# Patient Record
Sex: Male | Born: 1995 | Race: White | Hispanic: No | Marital: Single | Smoking: Never smoker
Health system: Southern US, Community
[De-identification: ages and names within clinical notes are randomized; demographics above are authoritative.]

## PROBLEM LIST (undated history)

## (undated) DIAGNOSIS — F209 Schizophrenia, unspecified: Secondary | ICD-10-CM

## (undated) DIAGNOSIS — F840 Autistic disorder: Secondary | ICD-10-CM

## (undated) DIAGNOSIS — F319 Bipolar disorder, unspecified: Secondary | ICD-10-CM

## (undated) DIAGNOSIS — M419 Scoliosis, unspecified: Secondary | ICD-10-CM

## (undated) DIAGNOSIS — Q992 Fragile X chromosome: Secondary | ICD-10-CM

---

## 2014-07-19 ENCOUNTER — Emergency Department (HOSPITAL_COMMUNITY): Payer: Medicaid Other

## 2014-07-19 ENCOUNTER — Emergency Department (HOSPITAL_COMMUNITY)
Admission: EM | Admit: 2014-07-19 | Discharge: 2014-07-20 | Disposition: A | Discharge status: Home / Self Care | Payer: Medicaid Other | Attending: Emergency Medicine | Admitting: Emergency Medicine

## 2014-07-19 ENCOUNTER — Encounter (HOSPITAL_COMMUNITY): Payer: Self-pay | Admitting: *Deleted

## 2014-07-19 DIAGNOSIS — F840 Autistic disorder: Secondary | ICD-10-CM | POA: Diagnosis not present

## 2014-07-19 DIAGNOSIS — M439 Deforming dorsopathy, unspecified: Secondary | ICD-10-CM | POA: Diagnosis not present

## 2014-07-19 DIAGNOSIS — X58XXXA Exposure to other specified factors, initial encounter: Secondary | ICD-10-CM | POA: Insufficient documentation

## 2014-07-19 DIAGNOSIS — M62830 Muscle spasm of back: Secondary | ICD-10-CM | POA: Diagnosis not present

## 2014-07-19 DIAGNOSIS — Y998 Other external cause status: Secondary | ICD-10-CM | POA: Insufficient documentation

## 2014-07-19 DIAGNOSIS — S3992XA Unspecified injury of lower back, initial encounter: Secondary | ICD-10-CM | POA: Diagnosis present

## 2014-07-19 DIAGNOSIS — Q992 Fragile X chromosome: Secondary | ICD-10-CM | POA: Insufficient documentation

## 2014-07-19 DIAGNOSIS — S239XXA Sprain of unspecified parts of thorax, initial encounter: Secondary | ICD-10-CM | POA: Insufficient documentation

## 2014-07-19 DIAGNOSIS — Y9389 Activity, other specified: Secondary | ICD-10-CM | POA: Diagnosis not present

## 2014-07-19 DIAGNOSIS — Y9289 Other specified places as the place of occurrence of the external cause: Secondary | ICD-10-CM | POA: Insufficient documentation

## 2014-07-19 DIAGNOSIS — S39012A Strain of muscle, fascia and tendon of lower back, initial encounter: Secondary | ICD-10-CM | POA: Insufficient documentation

## 2014-07-19 DIAGNOSIS — M41129 Adolescent idiopathic scoliosis, site unspecified: Secondary | ICD-10-CM | POA: Diagnosis not present

## 2014-07-19 DIAGNOSIS — M419 Scoliosis, unspecified: Secondary | ICD-10-CM

## 2014-07-19 DIAGNOSIS — Z8659 Personal history of other mental and behavioral disorders: Secondary | ICD-10-CM | POA: Diagnosis not present

## 2014-07-19 DIAGNOSIS — S233XXA Sprain of ligaments of thoracic spine, initial encounter: Secondary | ICD-10-CM

## 2014-07-19 DIAGNOSIS — M549 Dorsalgia, unspecified: Secondary | ICD-10-CM

## 2014-07-19 DIAGNOSIS — S29012A Strain of muscle and tendon of back wall of thorax, initial encounter: Secondary | ICD-10-CM

## 2014-07-19 HISTORY — DX: Autistic disorder: F84.0

## 2014-07-19 HISTORY — DX: Scoliosis, unspecified: M41.9

## 2014-07-19 HISTORY — DX: Bipolar disorder, unspecified: F31.9

## 2014-07-19 HISTORY — DX: Schizophrenia, unspecified: F20.9

## 2014-07-19 HISTORY — DX: Fragile x chromosome: Q99.2

## 2014-07-19 MED ORDER — HYDROCODONE-ACETAMINOPHEN 5-325 MG PO TABS
2.0000 | ORAL_TABLET | Freq: Once | ORAL | Status: AC
  Administered 2014-07-19: 2 via ORAL
  Filled 2014-07-19: qty 2

## 2014-07-19 NOTE — ED Notes (Signed)
Pt has been riding in car for 7 hours and usually takes Naproxen for the pain and not helping.  Pt has scoliosis, autism, and bipolar

## 2014-07-19 NOTE — ED Provider Notes (Signed)
CSN: [id number]     Arrival date & time 07/19/14  2155 History  This chart was scribed for non-physician practitioner, Dierdre ForthHannah Bristal Steffy, PA-C,working with Mirian MoMatthew Gentry, MD, by Karle PlumberJennifer Tensley, ED Scribe. This patient was seen in room TR05C/TR05C and the patient's care was started at 10:25 PM.  Chief Complaint  Patient presents with  . Back Pain   Patient is a [age] y.o. male presenting with back pain. The history is provided by the patient and medical records. No language interpreter was used.  Back Pain Associated symptoms: no abdominal pain, no chest pain, no dysuria, no fever, no headaches, no numbness and no weakness     HPI Comments:  Keith MapleBobby Fawley Jr. is a 18 y.o. autistic male with PMH of scoliosis who presents to the Emergency Department complaining of severe mid right sided back pain that started earlier today. His mother reports they have been riding in a car for the past 7 hours which she believes has caused the pain. She states he has taken Naproxen and Ibuprofen and applied a heating pad with no significant relief of the pain. He states standing up makes the pain worse. Denies any alleviating factors. Pt normally takes Naproxen for back pain but it has not been helping today. Mother states he has had issues with his back during a growth spurt in the past and was treated with Vicodin. She states he is now having another growth spurt. He denies any trauma, injury or fall. Denies nausea, vomiting, fever or chills. PMH of bipolar disorder, schizophrenia and Fragile X syndrome.  Past Medical History  Diagnosis Date  . Bipolar 1 disorder   . Autistic behavior   . Schizophrenia   . Scoliosis   . Fragile X syndrome    History reviewed. No pertinent past surgical history. No family history on file. History  Substance Use Topics  . Smoking status: Never Smoker   . Smokeless tobacco: Not on file  . Alcohol Use: No    Review of Systems  Constitutional: Negative for fever, chills and  fatigue.  Respiratory: Negative for chest tightness and shortness of breath.   Cardiovascular: Negative for chest pain.  Gastrointestinal: Negative for nausea, vomiting, abdominal pain and diarrhea.  Genitourinary: Negative for dysuria, urgency, frequency and hematuria.  Musculoskeletal: Positive for back pain and gait problem ( 2/2 pain). Negative for joint swelling, neck pain and neck stiffness.  Skin: Negative for rash.  Neurological: Negative for weakness, light-headedness, numbness and headaches.  All other systems reviewed and are negative.   Allergies  Review of patient's allergies indicates no known allergies.  Home Medications   Prior to Admission medications   Medication Sig Start Date End Date Taking? Authorizing Provider  HYDROcodone-acetaminophen (NORCO/VICODIN) 5-325 MG per tablet Take 1-2 tablets by mouth every 8 (eight) hours as needed for moderate pain or severe pain. 07/20/14   Wilfrido Luedke, PA-C   Triage Vitals: BP 111/69 mmHg  Pulse 69  Temp(Src) 97.9 F (36.6 C) (Oral)  Resp 18  SpO2 97% Physical Exam  Constitutional: He appears well-developed and well-nourished. No distress.  HENT:  Head: Normocephalic and atraumatic.  Mouth/Throat: Oropharynx is clear and moist. No oropharyngeal exudate.  Eyes: Conjunctivae are normal.  Neck: Normal range of motion. Neck supple.  Full ROM without pain  Cardiovascular: Normal rate, regular rhythm, normal heart sounds and intact distal pulses.   No murmur heard. Pulmonary/Chest: Effort normal and breath sounds normal. No respiratory distress. He has no wheezes.  Abdominal: Soft. Bowel sounds  are normal. He exhibits no distension. There is no tenderness.  Musculoskeletal:  Full range of motion of the T-spine and L-spine No tenderness to palpation of the spinous processes of the T-spine or L-spine Mild tenderness to palpation of the right upper paraspinous muscles of the T-spine Mild curvature of the spine due to  scoliosis  Lymphadenopathy:    He has no cervical adenopathy.  Neurological: He is alert. He has normal reflexes. He exhibits normal muscle tone. Coordination normal.  Reflex Scores:      Bicep reflexes are 2+ on the right side and 2+ on the left side.      Brachioradialis reflexes are 2+ on the right side and 2+ on the left side.      Patellar reflexes are 2+ on the right side and 2+ on the left side.      Achilles reflexes are 2+ on the right side and 2+ on the left side. Speech is clear and goal oriented, follows commands Normal 5/5 strength in upper and lower extremities bilaterally including dorsiflexion and plantar flexion, strong and equal grip strength Sensation normal to light and sharp touch Moves extremities without ataxia, coordination intact Antalgic gait Normal balance No Clonus   Skin: Skin is warm and dry. No rash noted. He is not diaphoretic. No erythema.  Psychiatric: He has a normal mood and affect. His behavior is normal.  Nursing note and vitals reviewed.   ED Course  Procedures (including critical care time) DIAGNOSTIC STUDIES: Oxygen Saturation is 97% on RA, normal by my interpretation.   COORDINATION OF CARE: 10:30 PM- Will X-Ray T-Spine and order pain medication. Pt verbalizes understanding and agrees to plan.  Medications  HYDROcodone-acetaminophen (NORCO/VICODIN) 5-325 MG per tablet 2 tablet (2 tablets Oral Given 07/19/14 2309)   Labs Review Labs Reviewed - No data to display  Imaging Review Dg Thoracic Spine 2 View  07/19/2014   CLINICAL DATA:  18 year old male with back pain  EXAM: THORACIC SPINE - 2 VIEW  COMPARISON:  None.  FINDINGS: There is no evidence of thoracic spine fracture. Alignment is normal. No other significant bone abnormalities are identified.  IMPRESSION: Negative.   Electronically Signed   By: Malachy MoanHeath  McCullough M.D.   On: 07/19/2014 23:57     EKG Interpretation None      MDM   Final diagnoses:  Back pain  Scoliosis  deformity of spine  Thoracic sprain and strain, initial encounter  Spasm of thoracolumbar muscle   Keith MapleBobby Grisso Jr. presents with right upper thoracic paraspinal back pain.  Pt with HX of scoliosis and mother is concerned that things might be "out of alignment."  Pain is atraumatic and no abnormality is appreciated on clinical exam.  Will give pain control, obtain x-ray and reassess.  Patient can walk but states is painful.  12:17 AM X-ray without acute abnormality.  No neurological deficits and normal neuro exam.  Patient's antalgic gait has improved significantly with pain control.  No loss of bowel or bladder control.  No concern for cauda equina.  No fever, night sweats, weight loss, h/o cancer, IVDU.    Will d/c home with pain control and instructions for close follow-up with PCP when they return home.   I have personally reviewed patient's vitals, nursing note and any pertinent labs or imaging.  I performed an focused physical exam; undressed when appropriate .    It has been determined that no acute conditions requiring further emergency intervention are present at this time. The patient/guardian  have been advised of the diagnosis and plan. I reviewed any labs and imaging including any potential incidental findings. We have discussed signs and symptoms that warrant return to the ED and they are listed in the discharge instructions.    Vital signs are stable at discharge.   BP 111/69 mmHg  Pulse 69  Temp(Src) 97.9 F (36.6 C) (Oral)  Resp 18  SpO2 97%  I personally performed the services described in this documentation, which was scribed in my presence. The recorded information has been reviewed and is accurate.    Dahlia Client Taggert Bozzi, PA-C 07/20/14 0017  Mirian Mo, MD 07/24/14 [phone]

## 2014-07-20 MED ORDER — HYDROCODONE-ACETAMINOPHEN 5-325 MG PO TABS: 1.0000 | ORAL_TABLET | Freq: Three times a day (TID) | ORAL | Status: AC | PRN

## 2014-07-20 NOTE — Discharge Instructions (Signed)
1. Medications: vicodin, usual home medications 2. Treatment: rest, drink plenty of fluids, gentle stretching as discussed, alternate ice and heat 3. Follow Up: Please followup with your primary doctor in 3 days for discussion of your diagnoses and further evaluation after today's visit; if you do not have a primary care doctor use the resource guide provided to find one;  Return to the ER for worsening back pain, difficulty walking, loss of bowel or bladder control or other concerning symptoms    Back Exercises Back exercises help treat and prevent back injuries. The goal of back exercises is to increase the strength of your abdominal and back muscles and the flexibility of your back. These exercises should be started when you no longer have back pain. Back exercises include:  Pelvic Tilt. Lie on your back with your knees bent. Tilt your pelvis until the lower part of your back is against the floor. Hold this position 5 to 10 sec and repeat 5 to 10 times.  Knee to Chest. Pull first 1 knee up against your chest and hold for 20 to 30 seconds, repeat this with the other knee, and then both knees. This may be done with the other leg straight or bent, whichever feels better.  Sit-Ups or Curl-Ups. Bend your knees 90 degrees. Start with tilting your pelvis, and do a partial, slow sit-up, lifting your trunk only 30 to 45 degrees off the floor. Take at least 2 to 3 seconds for each sit-up. Do not do sit-ups with your knees out straight. If partial sit-ups are difficult, simply do the above but with only tightening your abdominal muscles and holding it as directed.  Hip-Lift. Lie on your back with your knees flexed 90 degrees. Push down with your feet and shoulders as you raise your hips a couple inches off the floor; hold for 10 seconds, repeat 5 to 10 times.  Back arches. Lie on your stomach, propping yourself up on bent elbows. Slowly press on your hands, causing an arch in your low back. Repeat 3 to 5  times. Any initial stiffness and discomfort should lessen with repetition over time.  Shoulder-Lifts. Lie face down with arms beside your body. Keep hips and torso pressed to floor as you slowly lift your head and shoulders off the floor. Do not overdo your exercises, especially in the beginning. Exercises may cause you some mild back discomfort which lasts for a few minutes; however, if the pain is more severe, or lasts for more than 15 minutes, do not continue exercises until you see your caregiver. Improvement with exercise therapy for back problems is slow.  See your caregivers for assistance with developing a proper back exercise program. Document Released: 09/15/2004 Document Revised: 10/31/2011 Document Reviewed: 06/09/2011 [facility] Patient Information 2015 [employer], [employer] [place]. This information is not intended to replace advice given to you by your health care provider. Make sure you discuss any questions you have with your health care provider.

## 2016-05-05 IMAGING — CR DG THORACIC SPINE 2V
3 series · 3 of 3 positions shown · non-contrast
Comparison: None.

CLINICAL DATA: 17-year-old male with back pain

EXAM:
THORACIC SPINE - 2 VIEW

[t-spine lat]
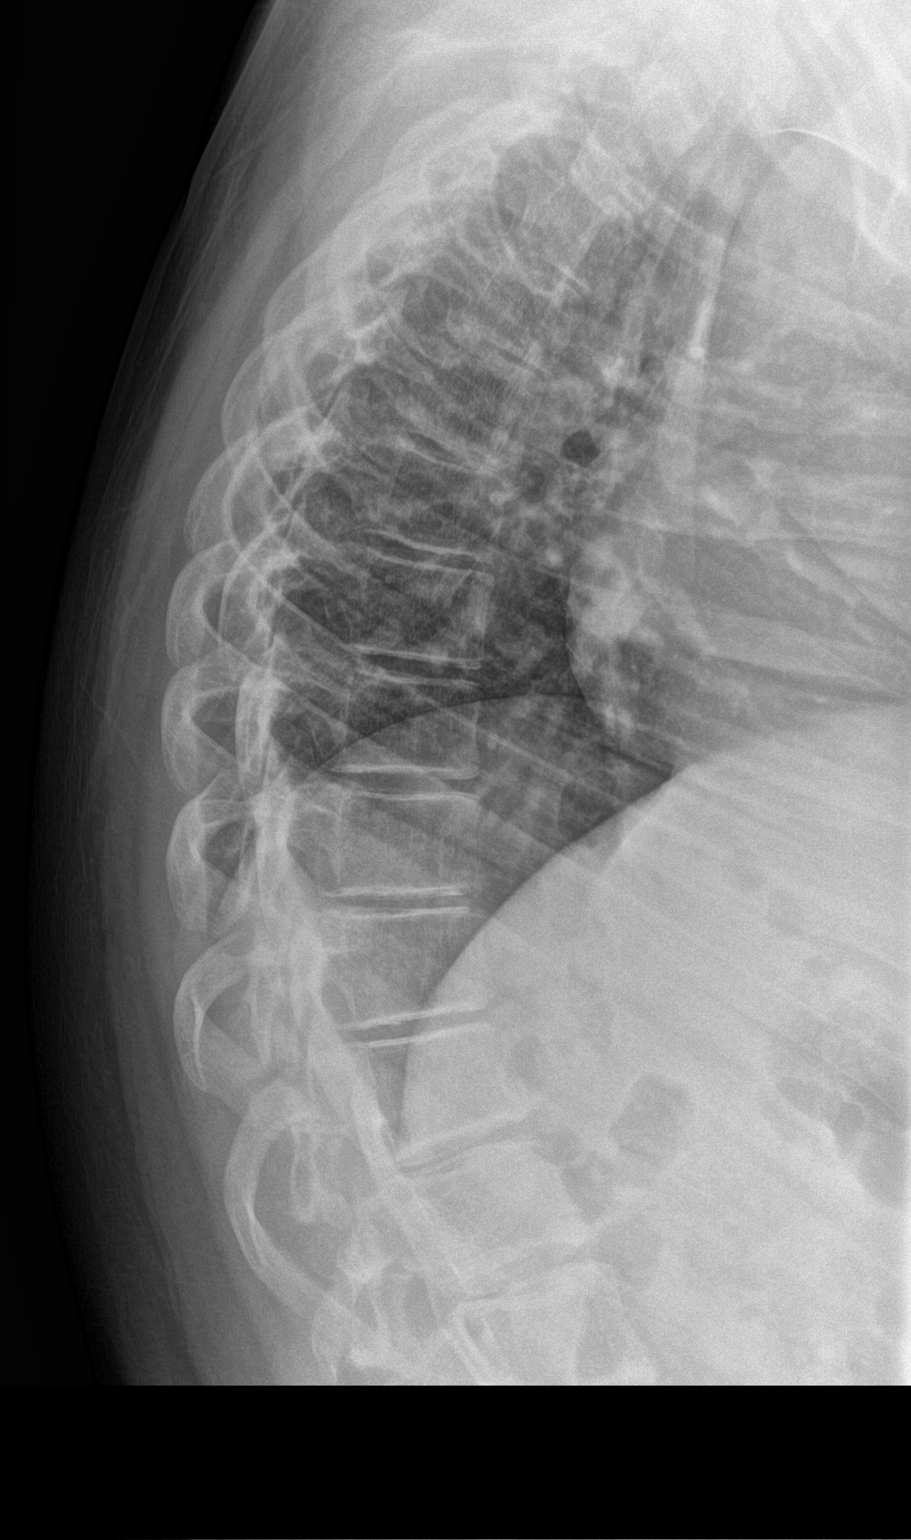

[t-spine swimmers]
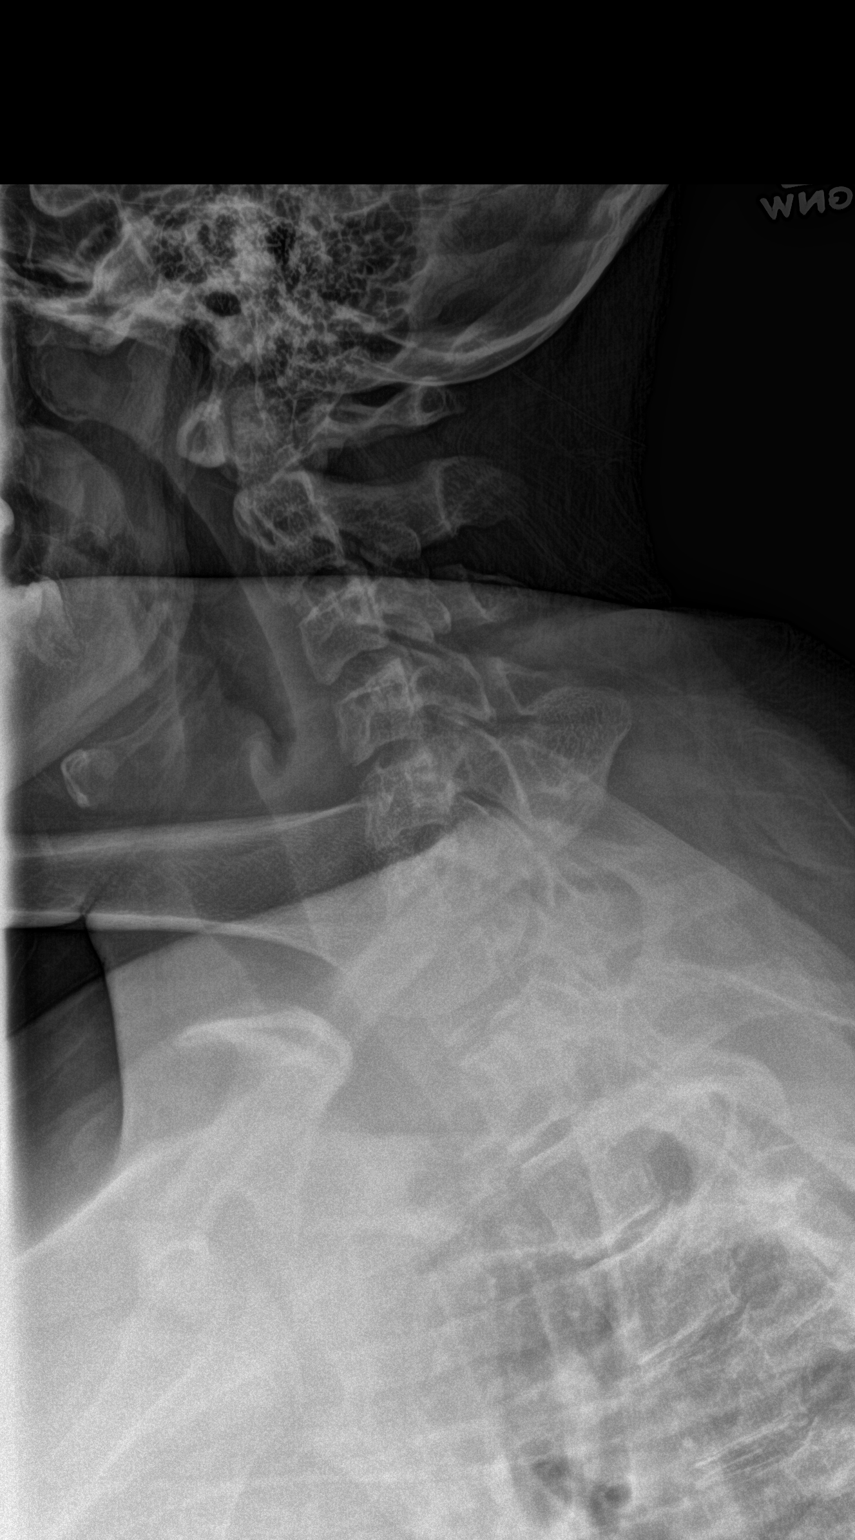

[t-spine ap]
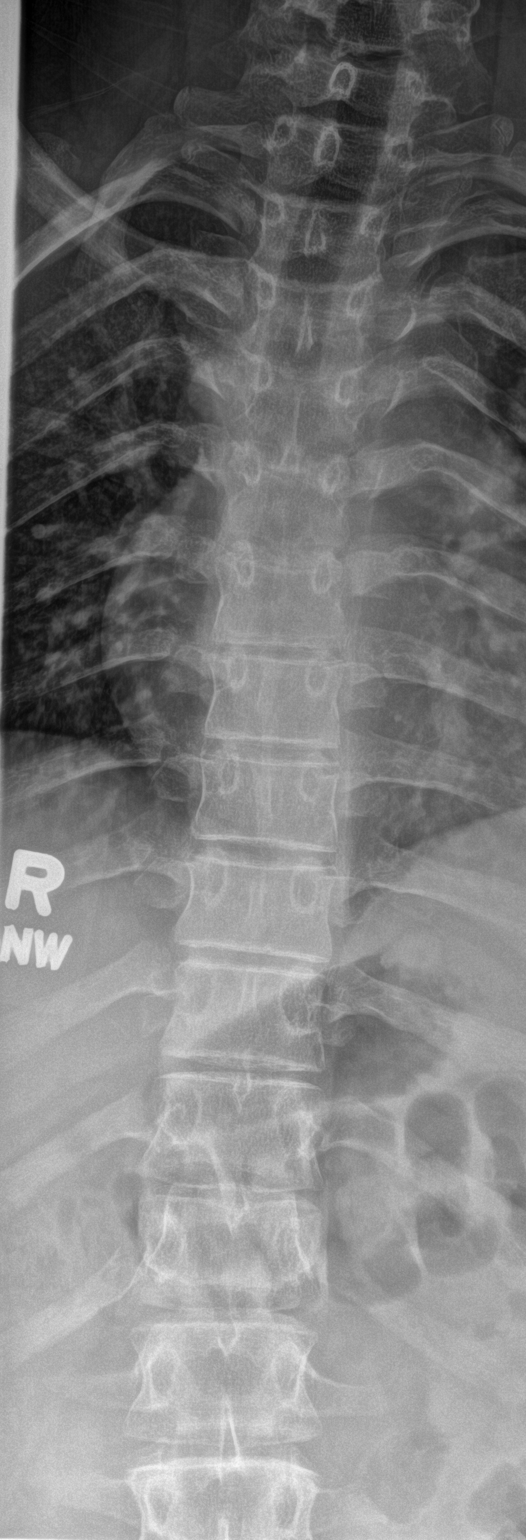

[3 of 3 positions shown; findings below may reference images not displayed]

FINDINGS: There is no evidence of thoracic spine fracture. Alignment is
normal. No other significant bone abnormalities are identified.
IMPRESSION: Negative.

## 2024-08-22 DEATH — deceased
